# Patient Record
Sex: Male | Born: 1960 | Race: White | Hispanic: No | Marital: Single | Smoking: Current every day smoker
Health system: Southern US, Community
[De-identification: ages and names within clinical notes are randomized; demographics above are authoritative.]

## PROBLEM LIST (undated history)

## (undated) DIAGNOSIS — B009 Herpesviral infection, unspecified: Secondary | ICD-10-CM

## (undated) DIAGNOSIS — K219 Gastro-esophageal reflux disease without esophagitis: Secondary | ICD-10-CM

## (undated) DIAGNOSIS — F419 Anxiety disorder, unspecified: Secondary | ICD-10-CM

## (undated) DIAGNOSIS — E7800 Pure hypercholesterolemia, unspecified: Secondary | ICD-10-CM

## (undated) HISTORY — DX: Herpesviral infection, unspecified: B00.9

## (undated) HISTORY — DX: Gastro-esophageal reflux disease without esophagitis: K21.9

## (undated) HISTORY — DX: Anxiety disorder, unspecified: F41.9

---

## 2000-08-09 ENCOUNTER — Encounter: Payer: Self-pay | Admitting: Family Medicine

## 2000-08-09 ENCOUNTER — Encounter: Admission: RE | Admit: 2000-08-09 | Discharge: 2000-08-09 | Payer: Self-pay | Admitting: Family Medicine

## 2002-02-02 ENCOUNTER — Encounter: Payer: Self-pay | Admitting: Family Medicine

## 2002-02-02 ENCOUNTER — Encounter: Admission: RE | Admit: 2002-02-02 | Discharge: 2002-02-02 | Payer: Self-pay | Admitting: Family Medicine

## 2004-04-20 ENCOUNTER — Ambulatory Visit (HOSPITAL_COMMUNITY): Admission: RE | Admit: 2004-04-20 | Discharge: 2004-04-20 | Payer: Self-pay | Admitting: Urology

## 2004-04-20 ENCOUNTER — Encounter (INDEPENDENT_AMBULATORY_CARE_PROVIDER_SITE_OTHER): Payer: Self-pay | Admitting: Specialist

## 2004-04-20 ENCOUNTER — Ambulatory Visit (HOSPITAL_BASED_OUTPATIENT_CLINIC_OR_DEPARTMENT_OTHER): Admission: RE | Admit: 2004-04-20 | Discharge: 2004-04-20 | Payer: Self-pay | Admitting: Urology

## 2005-01-31 ENCOUNTER — Ambulatory Visit (HOSPITAL_COMMUNITY): Admission: RE | Admit: 2005-01-31 | Discharge: 2005-01-31 | Payer: Self-pay | Admitting: Gastroenterology

## 2009-03-04 ENCOUNTER — Ambulatory Visit (HOSPITAL_BASED_OUTPATIENT_CLINIC_OR_DEPARTMENT_OTHER): Admission: RE | Admit: 2009-03-04 | Discharge: 2009-03-04 | Payer: Self-pay | Admitting: Urology

## 2010-01-31 ENCOUNTER — Encounter
Admission: RE | Admit: 2010-01-31 | Discharge: 2010-01-31 | Payer: Self-pay | Source: Home / Self Care | Attending: Gastroenterology | Admitting: Gastroenterology

## 2010-05-07 LAB — POCT HEMOGLOBIN-HEMACUE: Hemoglobin: 15.8 g/dL (ref 13.0–17.0)

## 2010-07-07 NOTE — Op Note (Signed)
NAME:  Robert Santana, Robert Santana                 ACCOUNT NO.:  [id number]   MEDICAL RECORD NO.:  [id number]          PATIENT TYPE:  AMB   LOCATION:  [facility]                         FACILITY:  [facility]   PHYSICIAN:  Sigmund I. Patsi Sears, M.D.DATE OF BIRTH:  08-22-1960   DATE OF PROCEDURE:  04/20/2004  DATE OF DISCHARGE:                                 OPERATIVE REPORT   PREOPERATIVE DIAGNOSIS:  Right scrotal mass and elective sterilization.   POSTOPERATIVE DIAGNOSIS:  Right scrotal mass and elective sterilization.   OPERATION/PROCEDURE:  1.  Right scrotal expiration with right partial epididymectomy and excision      of large right scrotal cystic mass.  2.  Vasectomy.   SURGEON:  Sigmund I. Patsi Sears, M.D.   ANESTHESIA:  General, LMA.   DESCRIPTION OF PROCEDURE:  After appropriate preanesthesia, the patient was  brought to the operating room, placed on the operating table in dorsal  supine position where general LMA anesthesia was introduced.  He remained in  this position and the pubis was prepped with Betadine solution and soap in  the scrotum which was prepped with Betadine solution and draped in the usual  fashion.   A 4 cm right hemiscrotal incision was made, the testicle delivered in the  wound.  Enlarged 3 cm to 5 cm right epididymal cyst was identified,  necessitating partial epididymectomy.  This was accomplished by dissecting  the epididymis free from the testicle and doubly clamping and incising the  epididymis.  The tail was ligated with 2-0 Vicryl suture.   The remaining portion of the epididymis was dissected.  It consisted of a  epididymal nodule, measuring approximately 5 mm to 6 mm, and a 3 cm  epididymal cyst, which was drained.  The epididymal cyst was removed with  the distal portion ligated with 2-0 Vicryl suture.  Xylocaine 1% plain was  injected into the cord, into the wound edges.  The testicle was delivered  into the wound and the wound closed in two layers with 3-0  Vicryl suture,  running and interrupted suture.  Collodium was placed on the skin.   The vas was identified in the left hemiscrotum, and a 1.5 cm incision was  made over the vas.  Subcutaneous tissue was dissected and the vas delivered  in the wound.  Xylocaine was again injected proximally and distally and into  the  wound.  The vas was doubly clamped.  A portion removed.  Each end was then  cauterized, and ligated with 3-0 Vicryl suture.  The wound then closed in  two layers with 3-0 Vicryl suture.  The patient tolerated the procedure  well.  He was given IV Toradol, awakened and taken to the recovery room in  good condition.      SIT/MEDQ  D:  04/20/2004  T:  04/20/2004  Job:  [id number]

## 2011-10-31 ENCOUNTER — Other Ambulatory Visit: Payer: Self-pay | Admitting: Family Medicine

## 2011-10-31 ENCOUNTER — Other Ambulatory Visit (HOSPITAL_COMMUNITY): Payer: Self-pay | Admitting: Family Medicine

## 2011-10-31 DIAGNOSIS — R0989 Other specified symptoms and signs involving the circulatory and respiratory systems: Secondary | ICD-10-CM

## 2011-10-31 DIAGNOSIS — F17200 Nicotine dependence, unspecified, uncomplicated: Secondary | ICD-10-CM

## 2011-11-05 ENCOUNTER — Ambulatory Visit (HOSPITAL_COMMUNITY)
Admission: RE | Admit: 2011-11-05 | Discharge: 2011-11-05 | Disposition: A | Discharge status: Home / Self Care | Payer: BC Managed Care – PPO | Source: Ambulatory Visit | Attending: Family Medicine | Admitting: Family Medicine

## 2011-11-05 DIAGNOSIS — R0602 Shortness of breath: Secondary | ICD-10-CM | POA: Insufficient documentation

## 2011-11-05 MED ORDER — ALBUTEROL SULFATE (5 MG/ML) 0.5% IN NEBU
2.5000 mg | INHALATION_SOLUTION | Freq: Once | RESPIRATORY_TRACT | Status: AC
  Administered 2011-11-05: 2.5 mg via RESPIRATORY_TRACT

## 2011-11-06 ENCOUNTER — Ambulatory Visit
Admission: RE | Admit: 2011-11-06 | Discharge: 2011-11-06 | Disposition: A | Discharge status: Home / Self Care | Payer: No Typology Code available for payment source | Source: Ambulatory Visit | Attending: Family Medicine | Admitting: Family Medicine

## 2011-11-06 DIAGNOSIS — F17200 Nicotine dependence, unspecified, uncomplicated: Secondary | ICD-10-CM

## 2011-12-31 ENCOUNTER — Other Ambulatory Visit: Payer: Self-pay | Admitting: Dermatology

## 2013-08-19 IMAGING — CT CT CHEST NODULE FOLLOW UP LOW DOSE W/O CM
2 of 4 series · 15 of 36 positions shown, 18 images · non-contrast
Comparison: No priors.

CLINICAL DATA: History of smoking.  Lung cancer screening.

CT CHEST SCREENING WITHOUT CONTRAST
TECHNIQUE: Multidetector CT imaging of the chest was performed
following the standard low-dose protocol without IV contrast.

[Series 3: lung windows · axial · 0.70mm/px · z∈[-278,+21]mm · 12 of 265 slices shown, 15 images]
[im 13/265  mediastinal]
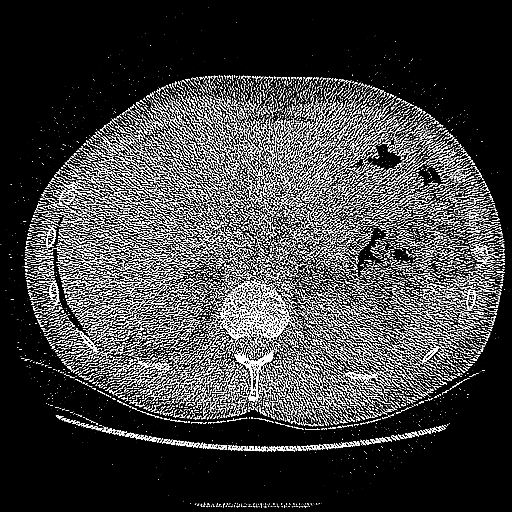
[im 13/265  lung]
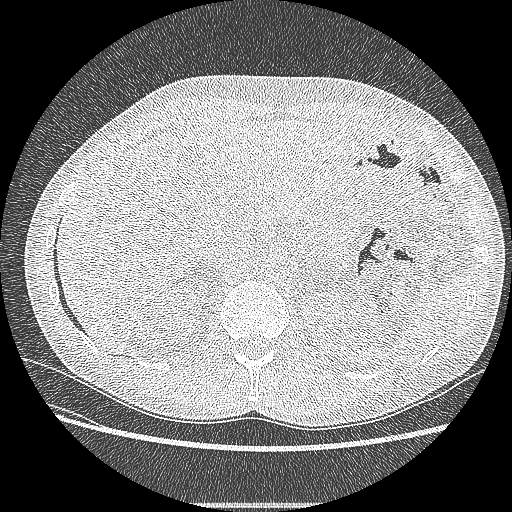
[im 38/265  lung]
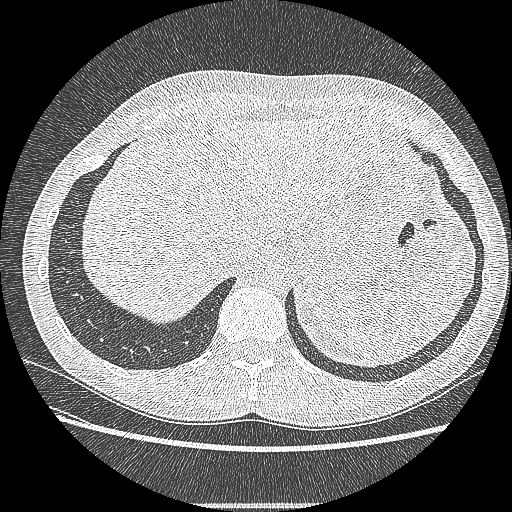
[im 63/265  lung]
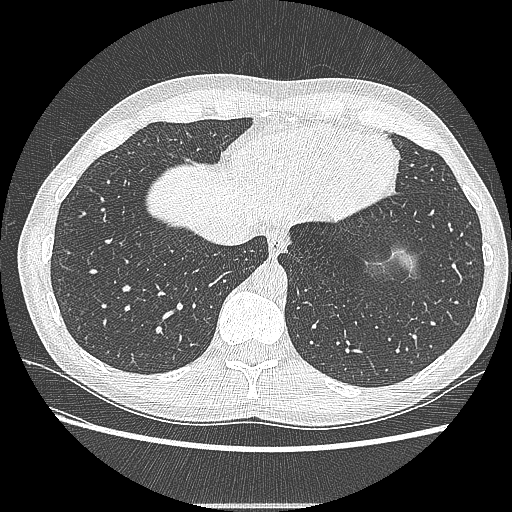
[im 76/265  lung]
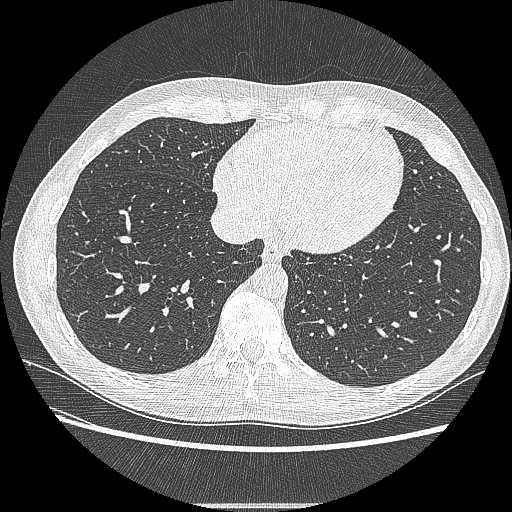
[im 101/265  mediastinal]
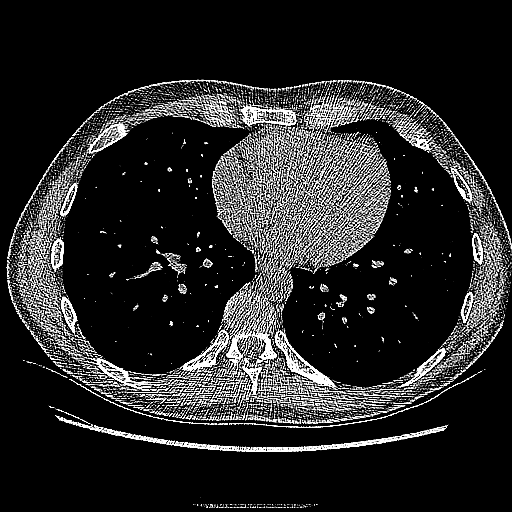
[im 101/265  lung]
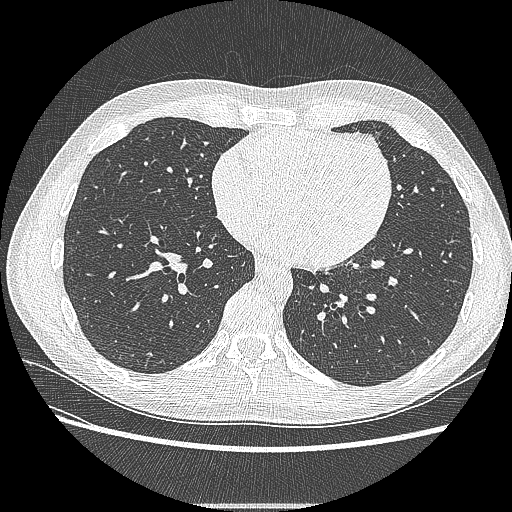
[im 126/265  lung]
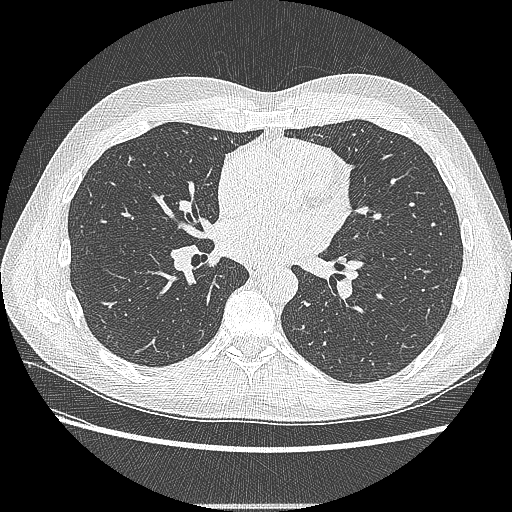
[im 139/265  lung]
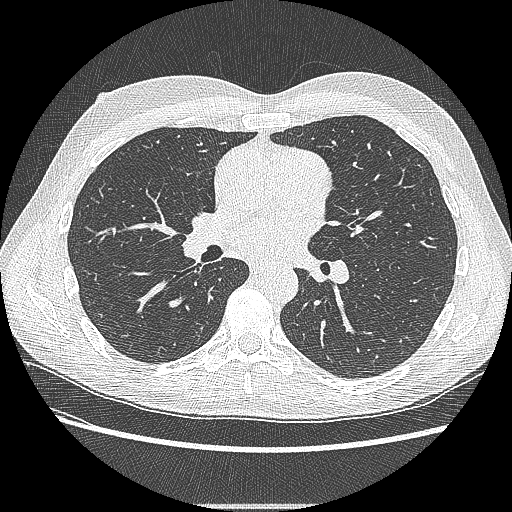
[im 164/265  lung]
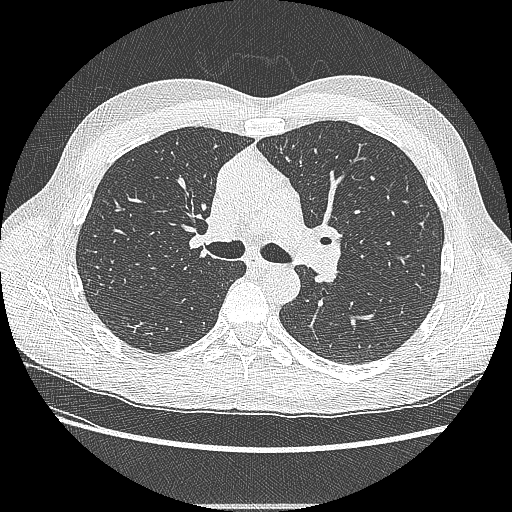
[im 189/265  mediastinal]
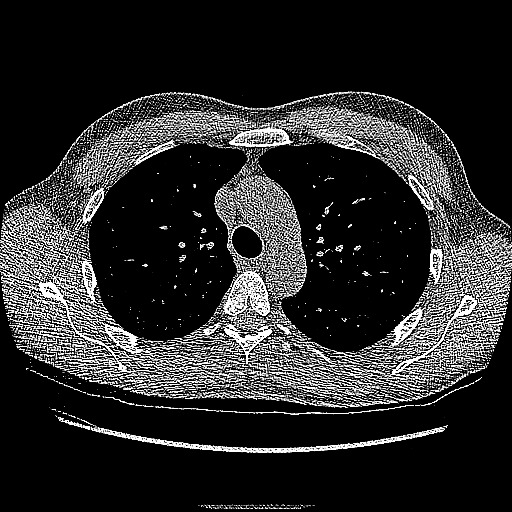
[im 189/265  lung]
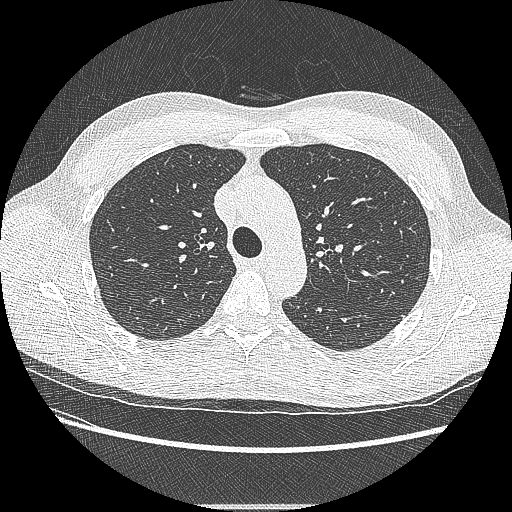
[im 202/265  lung]
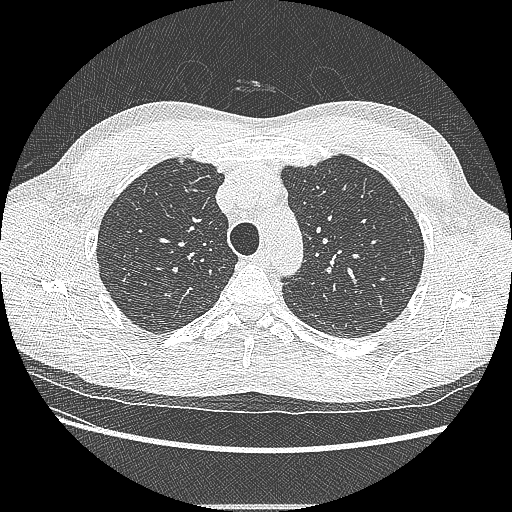
[im 227/265  lung]
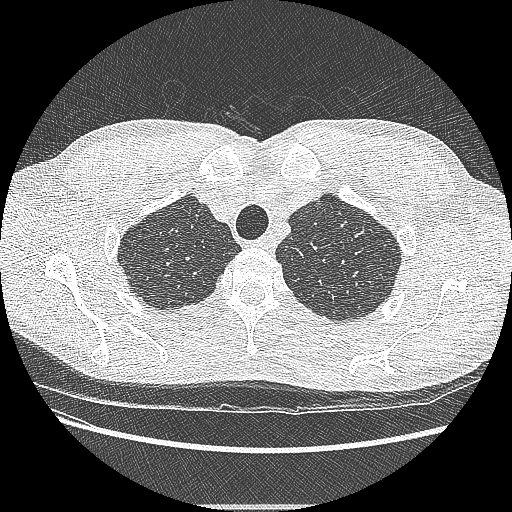
[im 252/265  lung]
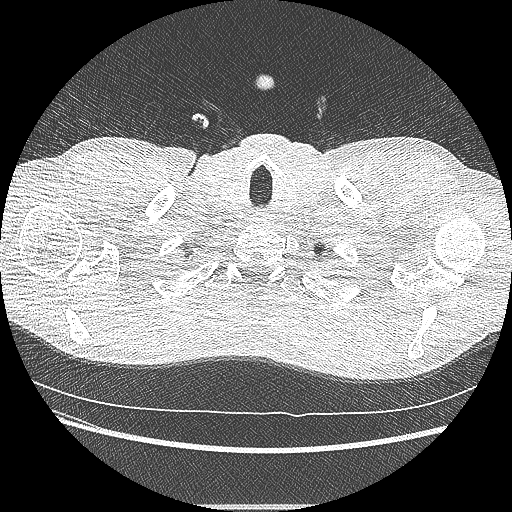

[Series 400: coronal · coronal · 0.70mm/px · 3 of 106 slices shown]
[im 22/106  lung]
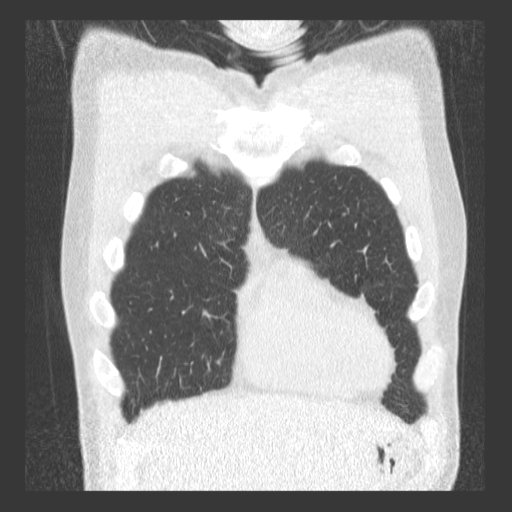
[im 43/106  lung]
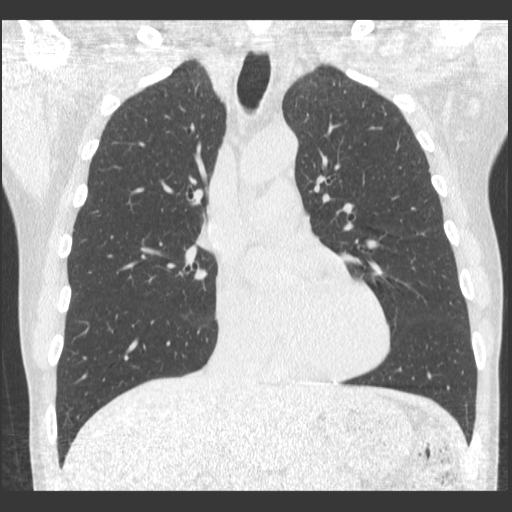
[im 64/106  lung]
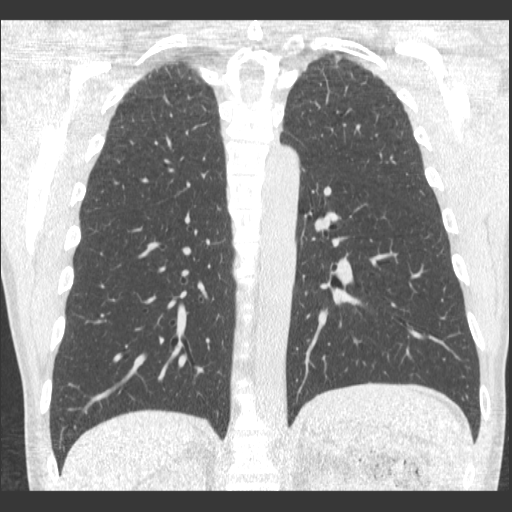

[15 of 36 positions shown; findings below may reference images not displayed]

FINDINGS: Mediastinum: Heart size is normal. There is no significant
pericardial fluid, thickening or pericardial calcification.  Mild
ectasia of the ascending thoracic aorta (4.2 cm in diameter). No
pathologically enlarged mediastinal or hilar lymph nodes. Please
note that accurate exclusion of hilar adenopathy is limited on
noncontrast CT scans.  Esophagus is unremarkable in appearance.

Lungs/Pleura: Mild bilateral apical pleuroparenchymal scarring,
likely secondary to prior infection.  No definite suspicious
appearing pulmonary nodules or masses are identified.  No
consolidative airspace disease.  No pleural effusions.

Upper Abdomen: Unremarkable.

Musculoskeletal: There are no aggressive appearing lytic or blastic
lesions noted in the visualized portions of the skeleton.
IMPRESSION: 1.  No suspicious abnormalities noted in the lungs on today's
examination to suggest lung cancer.
2.  Mild ectasia of the ascending thoracic aorta (4.2 cm in
diameter).

## 2017-03-01 ENCOUNTER — Ambulatory Visit
Admission: RE | Admit: 2017-03-01 | Discharge: 2017-03-01 | Disposition: A | Discharge status: Home / Self Care | Payer: 59 | Source: Ambulatory Visit | Attending: Family Medicine | Admitting: Family Medicine

## 2017-03-01 ENCOUNTER — Other Ambulatory Visit: Payer: Self-pay | Admitting: Family Medicine

## 2017-03-01 DIAGNOSIS — R05 Cough: Secondary | ICD-10-CM

## 2017-03-01 DIAGNOSIS — R059 Cough, unspecified: Secondary | ICD-10-CM

## 2018-12-13 IMAGING — CR DG CHEST 2V
2 series · 2 of 2 positions shown · non-contrast
Comparison: CT 11/06/2011.

CLINICAL DATA: Dry cough.  Chest congestion.

EXAM:
CHEST  2 VIEW

[w chest pa]
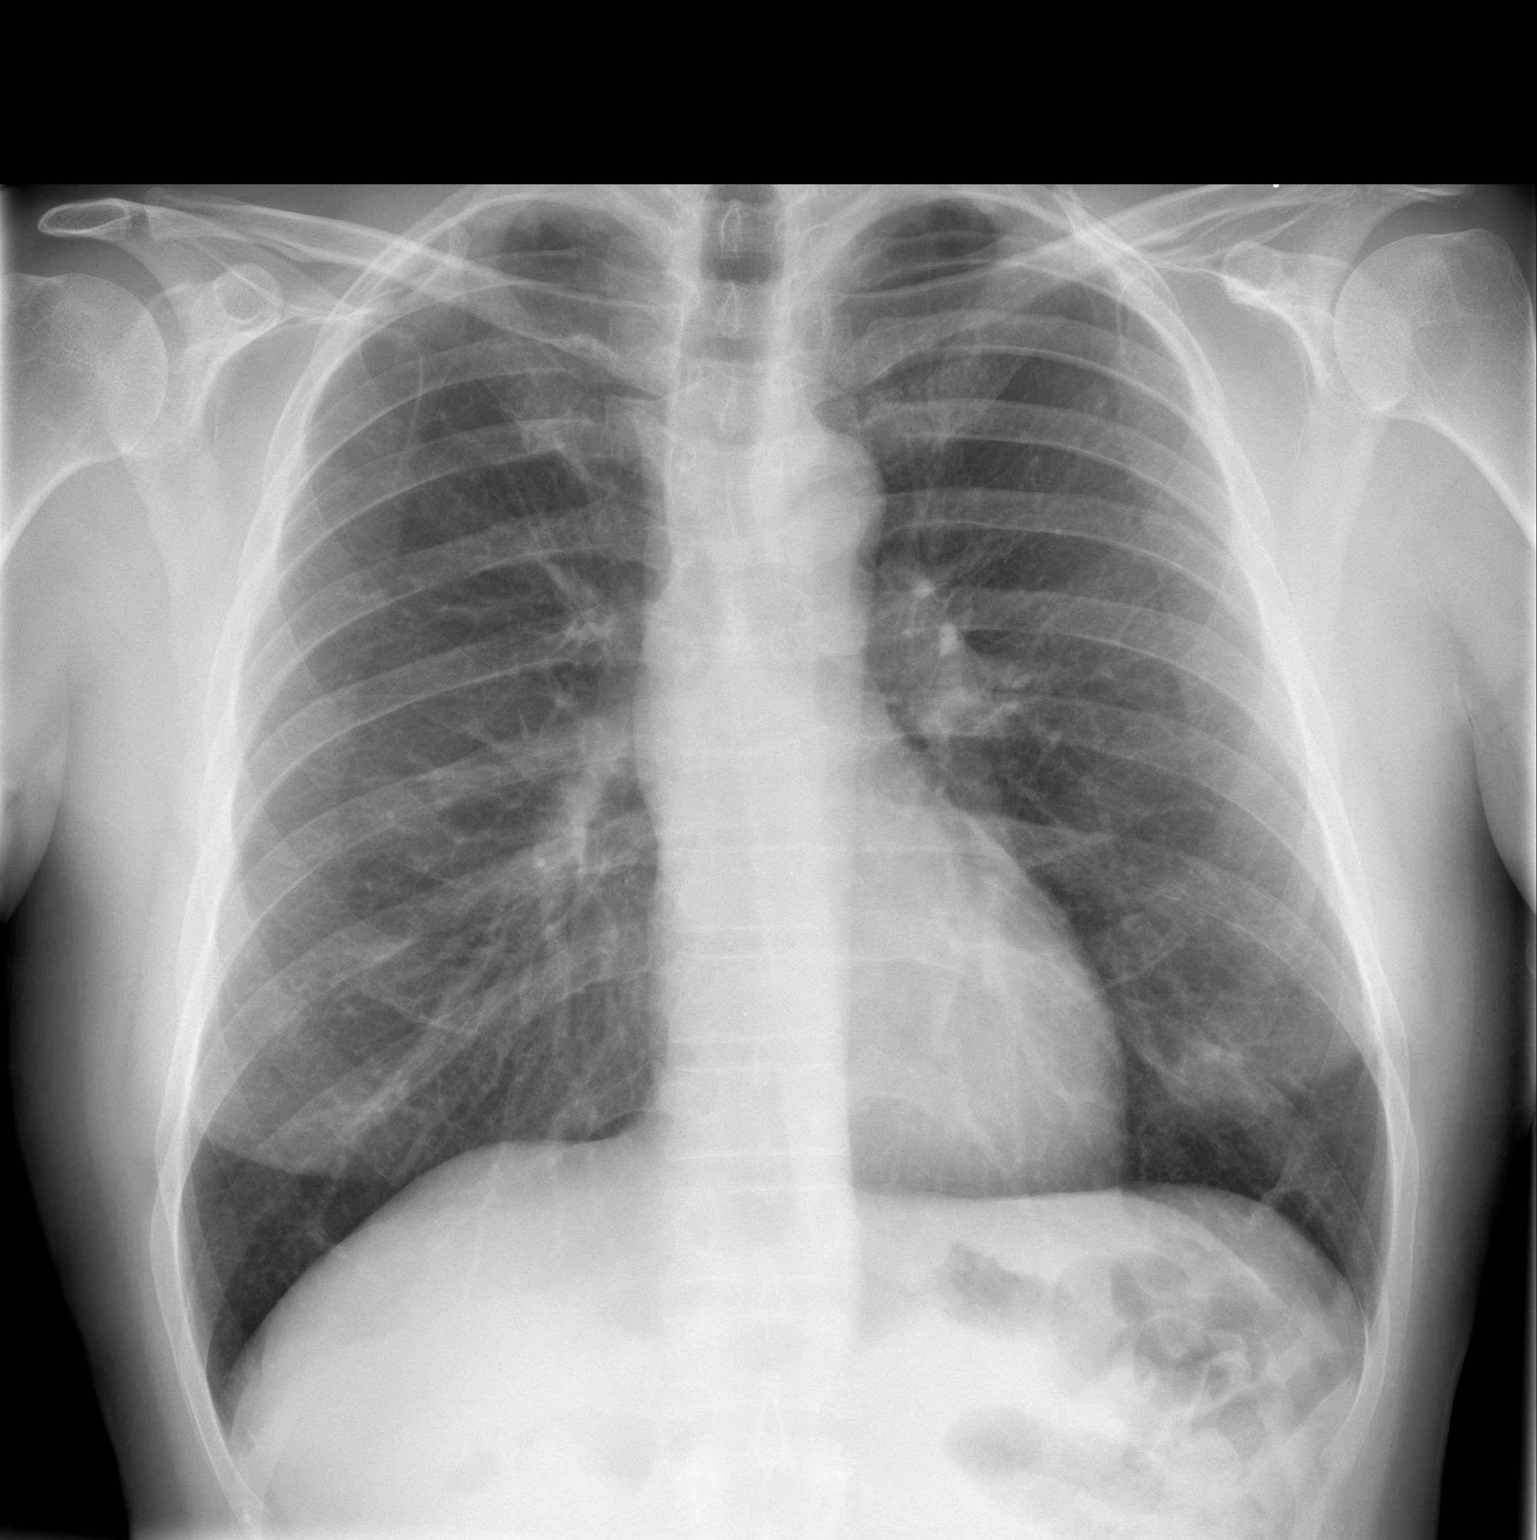

[w chest lat]
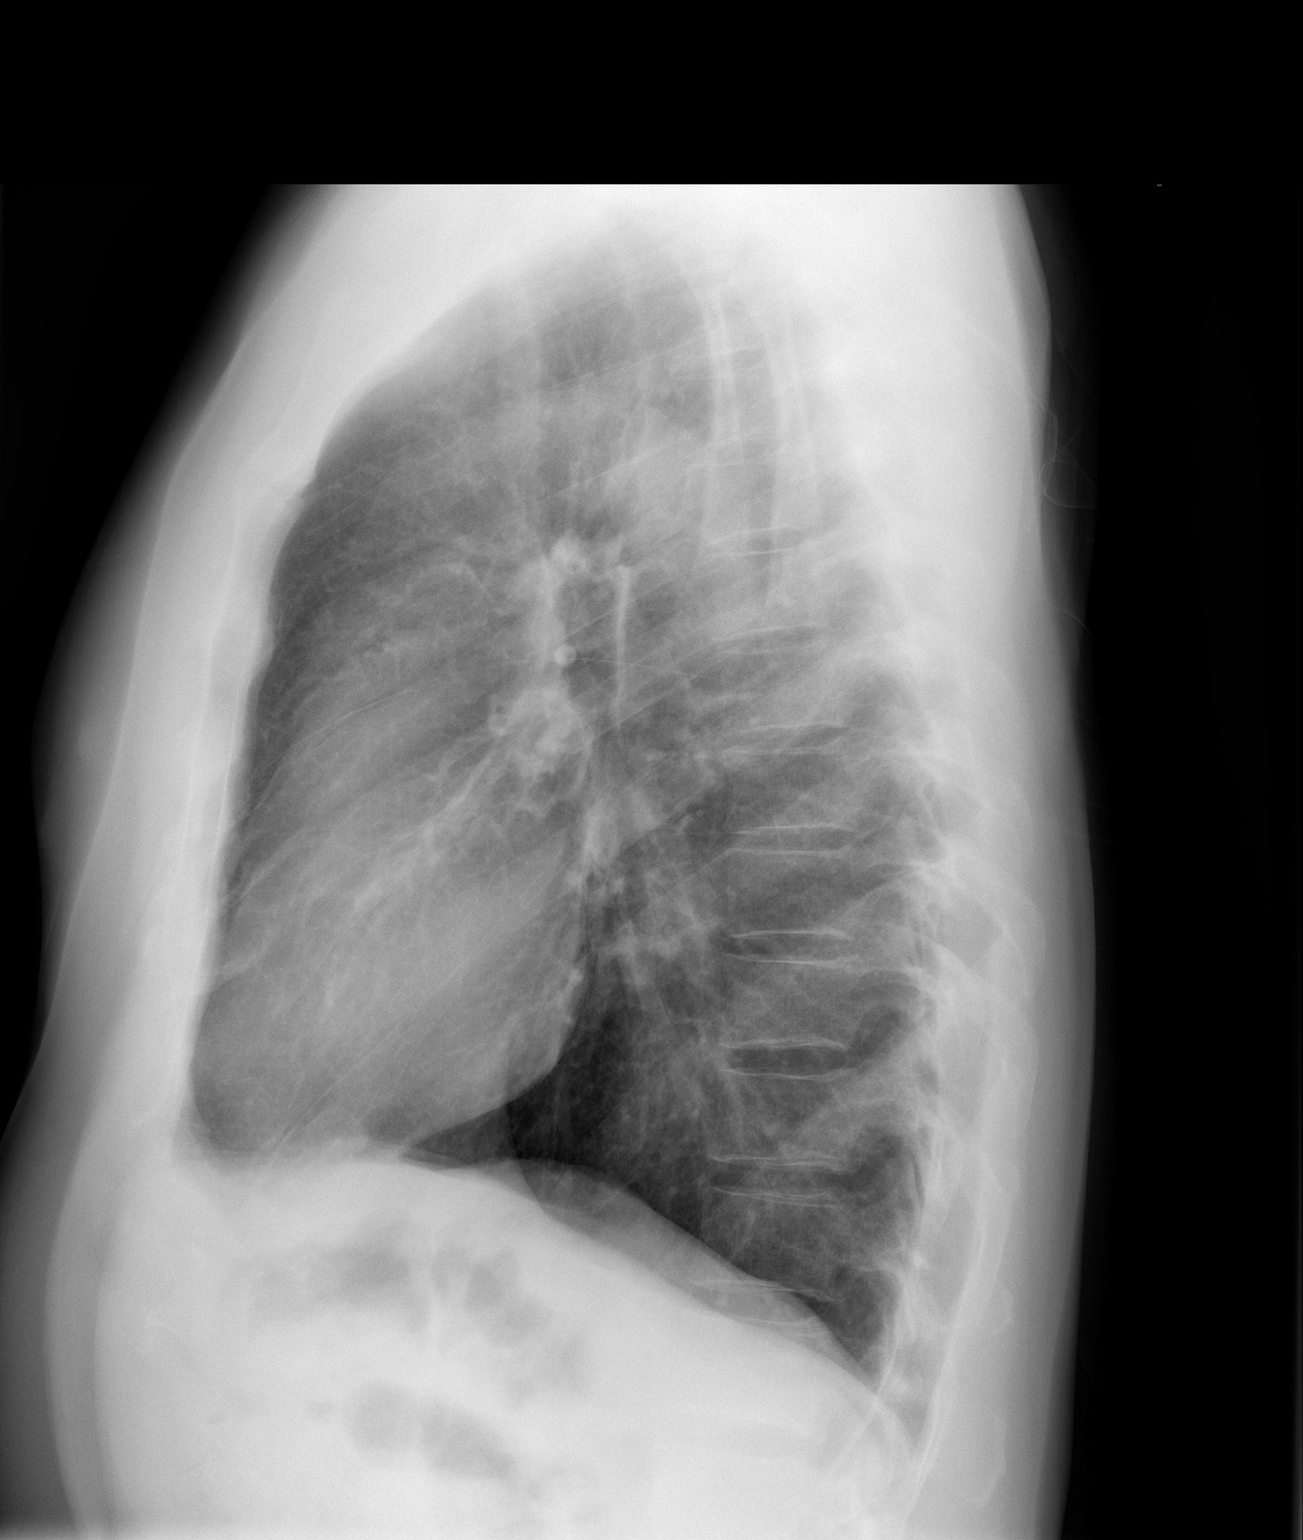

[2 of 2 positions shown; findings below may reference images not displayed]

FINDINGS: Mediastinum and hilar structures are normal. Lungs are clear.
Biapical pleural-parenchymal thickening consistent with scarring. No
pleural effusion or pneumothorax. Heart size normal. Mild scoliosis
thoracic spine. No acute bony abnormality.
IMPRESSION: 1. Biapical pleural-parenchymal thickening consistent with scarring.

2.  No acute cardiopulmonary disease.

## 2019-05-08 ENCOUNTER — Ambulatory Visit: Payer: 59 | Attending: Internal Medicine

## 2019-05-08 DIAGNOSIS — Z23 Encounter for immunization: Secondary | ICD-10-CM

## 2019-05-08 NOTE — Progress Notes (Signed)
   Covid-19 Vaccination Clinic  Name:  FIRAS GUARDADO    MRN: [id number] DOB: [birth year]  05/08/2019  Mr. Vogan was observed post Covid-19 immunization for 30 minutes based on pre-vaccination screening without incident. He was provided with Vaccine Information Sheet and instruction to access the V-Safe system.   Mr. Winkowski was instructed to call 911 with any severe reactions post vaccine: Marland Kitchen Difficulty breathing  . Swelling of face and throat  . A fast heartbeat  . A bad rash all over body  . Dizziness and weakness   Immunizations Administered    Name Date Dose VIS Date Route   Pfizer COVID-19 Vaccine 05/08/2019  9:01 AM 0.3 mL 01/30/2019 Intramuscular   Manufacturer: [employer], [employer]   Lot: [id number]   NDC: [address]-0

## 2019-06-02 ENCOUNTER — Ambulatory Visit: Payer: 59 | Attending: Internal Medicine

## 2019-06-02 DIAGNOSIS — Z23 Encounter for immunization: Secondary | ICD-10-CM

## 2019-06-02 NOTE — Progress Notes (Signed)
   Covid-19 Vaccination Clinic  Name:  HULBERT BRANSCOME    MRN: [id number] DOB: April 18, 1960  06/02/2019  Mr. Plaskett was observed post Covid-19 immunization for 15 minutes without incident. He was provided with Vaccine Information Sheet and instruction to access the V-Safe system.   Mr. Deboy was instructed to call 911 with any severe reactions post vaccine: Marland Kitchen Difficulty breathing  . Swelling of face and throat  . A fast heartbeat  . A bad rash all over body  . Dizziness and weakness   Immunizations Administered    Name Date Dose VIS Date Route   Pfizer COVID-19 Vaccine 06/02/2019  9:42 AM 0.3 mL 01/30/2019 Intramuscular   Manufacturer: [employer], [employer]   Lot: [id number]   NDC: [address]-8

## 2020-08-14 ENCOUNTER — Emergency Department (HOSPITAL_COMMUNITY)
Admission: EM | Admit: 2020-08-14 | Discharge: 2020-08-14 | Disposition: A | Discharge status: Home / Self Care | Payer: BLUE CROSS/BLUE SHIELD | Attending: Emergency Medicine | Admitting: Emergency Medicine

## 2020-08-14 ENCOUNTER — Other Ambulatory Visit: Payer: Self-pay

## 2020-08-14 ENCOUNTER — Encounter (HOSPITAL_COMMUNITY): Payer: Self-pay | Admitting: Emergency Medicine

## 2020-08-14 ENCOUNTER — Emergency Department (HOSPITAL_COMMUNITY): Payer: BLUE CROSS/BLUE SHIELD

## 2020-08-14 DIAGNOSIS — I491 Atrial premature depolarization: Secondary | ICD-10-CM | POA: Diagnosis not present

## 2020-08-14 DIAGNOSIS — F17210 Nicotine dependence, cigarettes, uncomplicated: Secondary | ICD-10-CM | POA: Diagnosis not present

## 2020-08-14 DIAGNOSIS — R002 Palpitations: Secondary | ICD-10-CM | POA: Diagnosis present

## 2020-08-14 HISTORY — DX: Pure hypercholesterolemia, unspecified: E78.00

## 2020-08-14 LAB — CBC
HCT: 42.2 % (ref 39.0–52.0)
Hemoglobin: 14.3 g/dL (ref 13.0–17.0)
MCH: 32.7 pg (ref 26.0–34.0)
MCHC: 33.9 g/dL (ref 30.0–36.0)
MCV: 96.6 fL (ref 80.0–100.0)
Platelets: 215 10*3/uL (ref 150–400)
RBC: 4.37 MIL/uL (ref 4.22–5.81)
RDW: 12.4 % (ref 11.5–15.5)
WBC: 8 10*3/uL (ref 4.0–10.5)
nRBC: 0 % (ref 0.0–0.2)

## 2020-08-14 LAB — BASIC METABOLIC PANEL
Anion gap: 7 (ref 5–15)
BUN: 16 mg/dL (ref 6–20)
CO2: 24 mmol/L (ref 22–32)
Calcium: 9.2 mg/dL (ref 8.9–10.3)
Chloride: 107 mmol/L (ref 98–111)
Creatinine, Ser: 1.15 mg/dL (ref 0.61–1.24)
GFR, Estimated: >60 mL/min (ref >60)
Glucose, Bld: 98 mg/dL (ref 70–99)
Potassium: 4.2 mmol/L (ref 3.5–5.1)
Sodium: 138 mmol/L (ref 135–145)

## 2020-08-14 LAB — TROPONIN I (HIGH SENSITIVITY): Troponin I (High Sensitivity): 4 ng/L (ref <18)

## 2020-08-14 NOTE — ED Triage Notes (Signed)
Pt reports intermittent palpitations with generalized weakness and dizziness over the past 3 days.  Went to [facility] and sent to ED for abnormal EKG.  Denies chest pain.

## 2020-08-14 NOTE — Discharge Instructions (Addendum)
Please begin taking 81 mg of aspirin daily. Decrease your caffeine intake. Avoid physical exertion until you can follow-up with cardiology and they cleared you to return to activity. You have been provided with referral to cardiology.  They should reach out and contact you in the next couple of days to schedule appointment.  If you have not heard from them in the next few days, you can give them a call to schedule an appointment.  You may need some additional cardiac work-up including stress test. In the meantime follow with your primary care. Return to the emergency department for develop chest pain or worsening symptoms.

## 2020-08-14 NOTE — ED Provider Notes (Signed)
[facility] EMERGENCY DEPARTMENT Provider Note   CSN: [id number] Arrival date & time: 08/14/20  1630     History Chief Complaint  Patient presents with   Palpitations    Robert Santana is a 60 y.o. male with PMHx HLD, presenting from urgent care for evaluation of palpitations that began 3 days ago. Symptoms initially began after a meal, however have been persisting since. He feels like his heart speeds up or skips a beat and sometimes it takes his breath away. He has been feeling somewhat fatigued, unsure if it is related to his increased stress level. 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 No drug use. Drinks about 5 caffeinated beverages daily. No associated chest pain, leg swelling, or diet changes. He states he runs 3 days per week. Does smoke tobacco regularly.   The history is provided by the patient.      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   History reviewed. No pertinent surgical history.     Family History  Problem Relation Age of Onset   Breast cancer Mother     Social History   Tobacco Use   Smoking status: Every Day    Pack years: 0.00    Types: Cigarettes   Smokeless tobacco: Never  Substance Use Topics   Alcohol use: Yes    Alcohol/week: 10.0 standard drinks    Types: 10 drink(s) per week   Drug use: Yes    Home Medications Prior to Admission medications   Medication Sig Start Date End Date Taking? Authorizing Provider  citalopram (CELEXA) 20 MG tablet Take 10 mg by mouth daily. 04/14/20  Yes [provider]  famotidine (PEPCID) 20 MG tablet Take 20 mg by mouth 2 (two) times daily.   Yes [provider]  loratadine (CLARITIN) 10 MG tablet Take 10 mg by mouth daily as needed for allergies.   Yes [provider]  meloxicam (MOBIC) 15 MG tablet Take 15 mg by mouth daily as needed for pain. 04/11/20  Yes [provider]  propranolol (INDERAL) 10 MG tablet Take 10 mg by mouth daily as needed for anxiety. 04/11/20  Yes [provider]  rosuvastatin (CRESTOR) 10 MG tablet Take 10 mg by mouth daily. 04/11/20  Yes [provider]  sildenafil (REVATIO) 20 MG tablet Take 20-100 mg by mouth daily as needed (for ED).   Yes [provider]  traZODone (DESYREL) 50 MG tablet Take 50 mg by mouth at bedtime as needed for sleep. 07/21/20  Yes [provider]    Allergies    Buspar [buspirone], Cymbalta [duloxetine hcl], Lexapro [escitalopram oxalate], Zoloft [sertraline hcl], and Zyban [bupropion]  Review of Systems   Review of Systems  All other systems reviewed and are negative.  Physical Exam Updated Vital Signs BP 123/83   Pulse 70   Temp 97.9 F (36.6 C)   Resp 15   SpO2 99%   Physical Exam Vitals and nursing note reviewed.  Constitutional:      General: He is not in acute distress.    Appearance: He is well-developed. He is not ill-appearing.     Comments: Fit-appearing male  HENT:     Head: Normocephalic and atraumatic.  Eyes:     Conjunctiva/sclera: Conjunctivae normal.  Cardiovascular:     Rate and Rhythm: Normal rate and regular rhythm.     Pulses: Normal pulses.     Heart sounds: Normal heart sounds.  Pulmonary:     Effort: Pulmonary effort is normal. No respiratory distress.     Breath sounds: Normal breath sounds.  Abdominal:     General: Bowel sounds are normal.     Palpations: Abdomen is soft.     Tenderness: There is no abdominal tenderness.  Musculoskeletal:     Right lower leg: No edema.     Left lower leg: No edema.  Skin:    General: Skin is warm.  Neurological:     Mental Status: He is alert.  Psychiatric:        Behavior: Behavior normal.    ED Results / Procedures / Treatments   Labs (all labs ordered are  listed, but only abnormal results are displayed) Labs Reviewed  BASIC METABOLIC PANEL  CBC  TROPONIN I (HIGH SENSITIVITY)    EKG EKG Interpretation  Date/Time:  Sunday August 14 2020 16:39:16 EDT Ventricular Rate:  69 PR Interval:  170 QRS Duration: 96 QT Interval:  374 QTC Calculation: 400 R Axis:   104 Text Interpretation: Sinus rhythm with Premature atrial complexes Biatrial enlargement Rightward axis Abnormal ECG Confirmed by Tilden Fossa [phone]) on 08/14/2020 5:02:17 PM  Radiology DG Chest 2 View  Result Date: 08/14/2020 CLINICAL DATA:  Chest pain EXAM: CHEST - 2 VIEW COMPARISON:  03/01/2017 FINDINGS: The heart size and mediastinal contours are within normal limits. Both lungs are clear. The visualized skeletal structures are unremarkable. IMPRESSION: No active cardiopulmonary disease. Electronically Signed   By: Lauralyn Primes M.D.   On: 08/14/2020 17:09    Procedures Procedures   Medications Ordered in ED Medications - No data to display  ED Course  I have reviewed the triage vital signs and the nursing notes.  Pertinent labs & imaging results that were available during my care of the patient were reviewed by me and considered in my medical decision making (see chart for details).  Clinical Course as of 08/14/20 1851  Sun Aug 14, 2020  9242 Daily asa, no exertion, cards amb referral, dec caffeine [JR]    Clinical Course User Index [JR] Julieanne Hadsall, [place] N, PA-C   MDM Rules/Calculators/A&P                          Patient has 60 year old male presenting for 3 days of intermittent palpitations.  States he is feeling some fatigue though is unsure if this is related to increased stress level at home.  Was seen at urgent care who sent to the ED for abnormal EKG.  EKG shows PACs, otherwise sinus rhythm.  He has the symptom during evaluation, cardiac monitor shows PAC.  This is suspected to be the symptom patient is experiencing.  He does endorse 5 caffeinated beverages  daily, as well as tobacco use.  He does however multiple days per week.  He is not having chest pain, lower extremity edema, or other associated symptoms.  On exam he is well-appearing and in no distress.  Blood work shows troponin within normal limits, metabolic panel and blood counts are also within normal limits.  Chest x-ray is clear.  Patient will be discharged with recommendation to begin daily baby aspirin, avoid physical exertion until he is able to follow up with cardiology outpatient.  Also  recommend decrease caffeine intake.  He is provided with ambulatory referral to cardiology, as well as instruction to follow closely with PCP.  Return if symptoms worsen in any way.  Discussed with attending physician, Dr. Donnald Garre, who guided care plan. Discussed results, findings, treatment and follow up. Patient advised of return precautions. Patient verbalized understanding and agreed with plan.  Final Clinical Impression(s) / ED Diagnoses Final diagnoses:  Premature atrial contractions    Rx / DC Orders ED Discharge Orders          Ordered    Ambulatory referral to Cardiology        08/14/20 1851             Deaira Leckey, [place] N, PA-C 08/14/20 1851    Arby Barrette, MD 08/18/20 [phone]

## 2022-05-28 IMAGING — DX DG CHEST 2V
2 series · 2 of 2 positions shown · non-contrast
Comparison: 03/01/2017

CLINICAL DATA: Chest pain

EXAM:
CHEST - 2 VIEW

[chest pa]
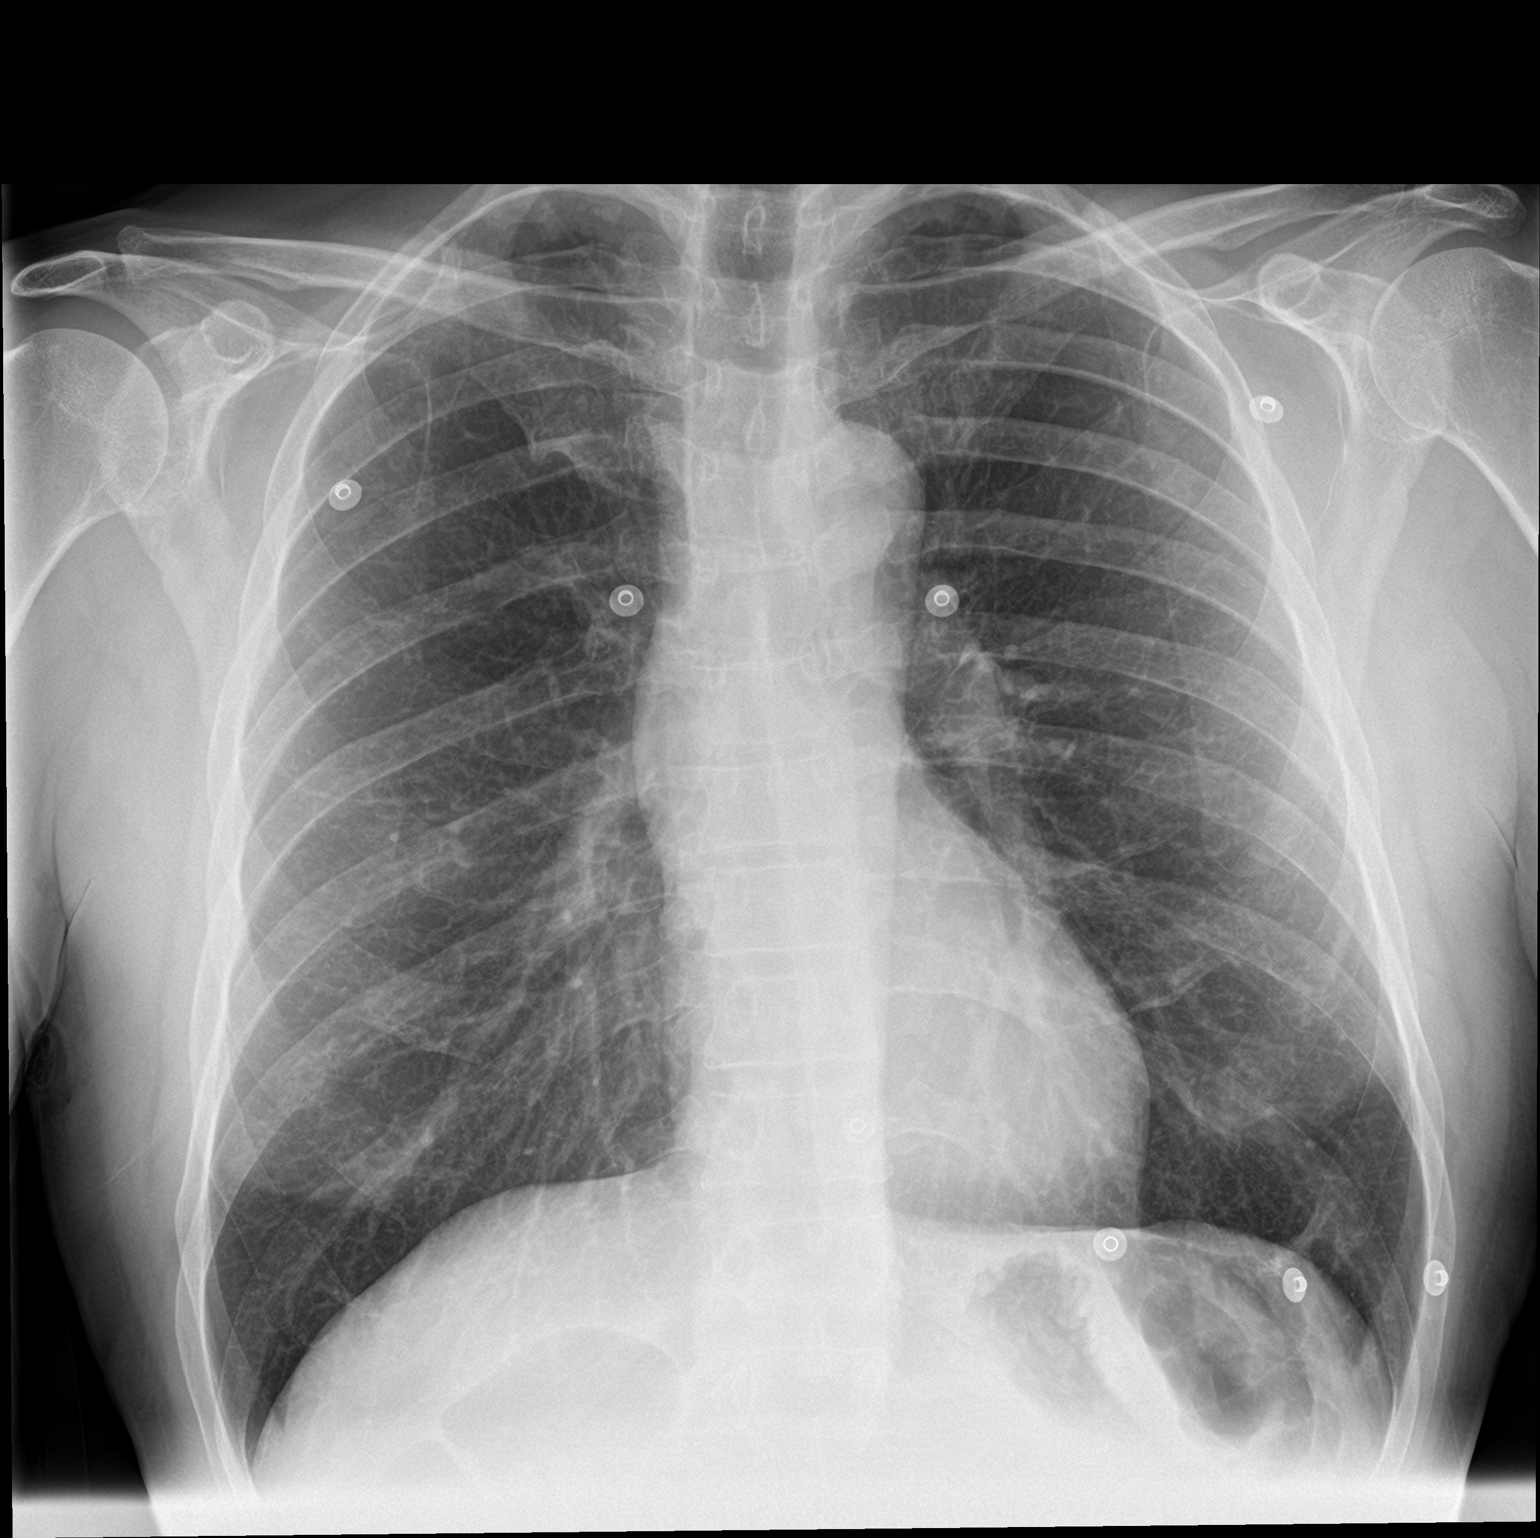

[chest lat]
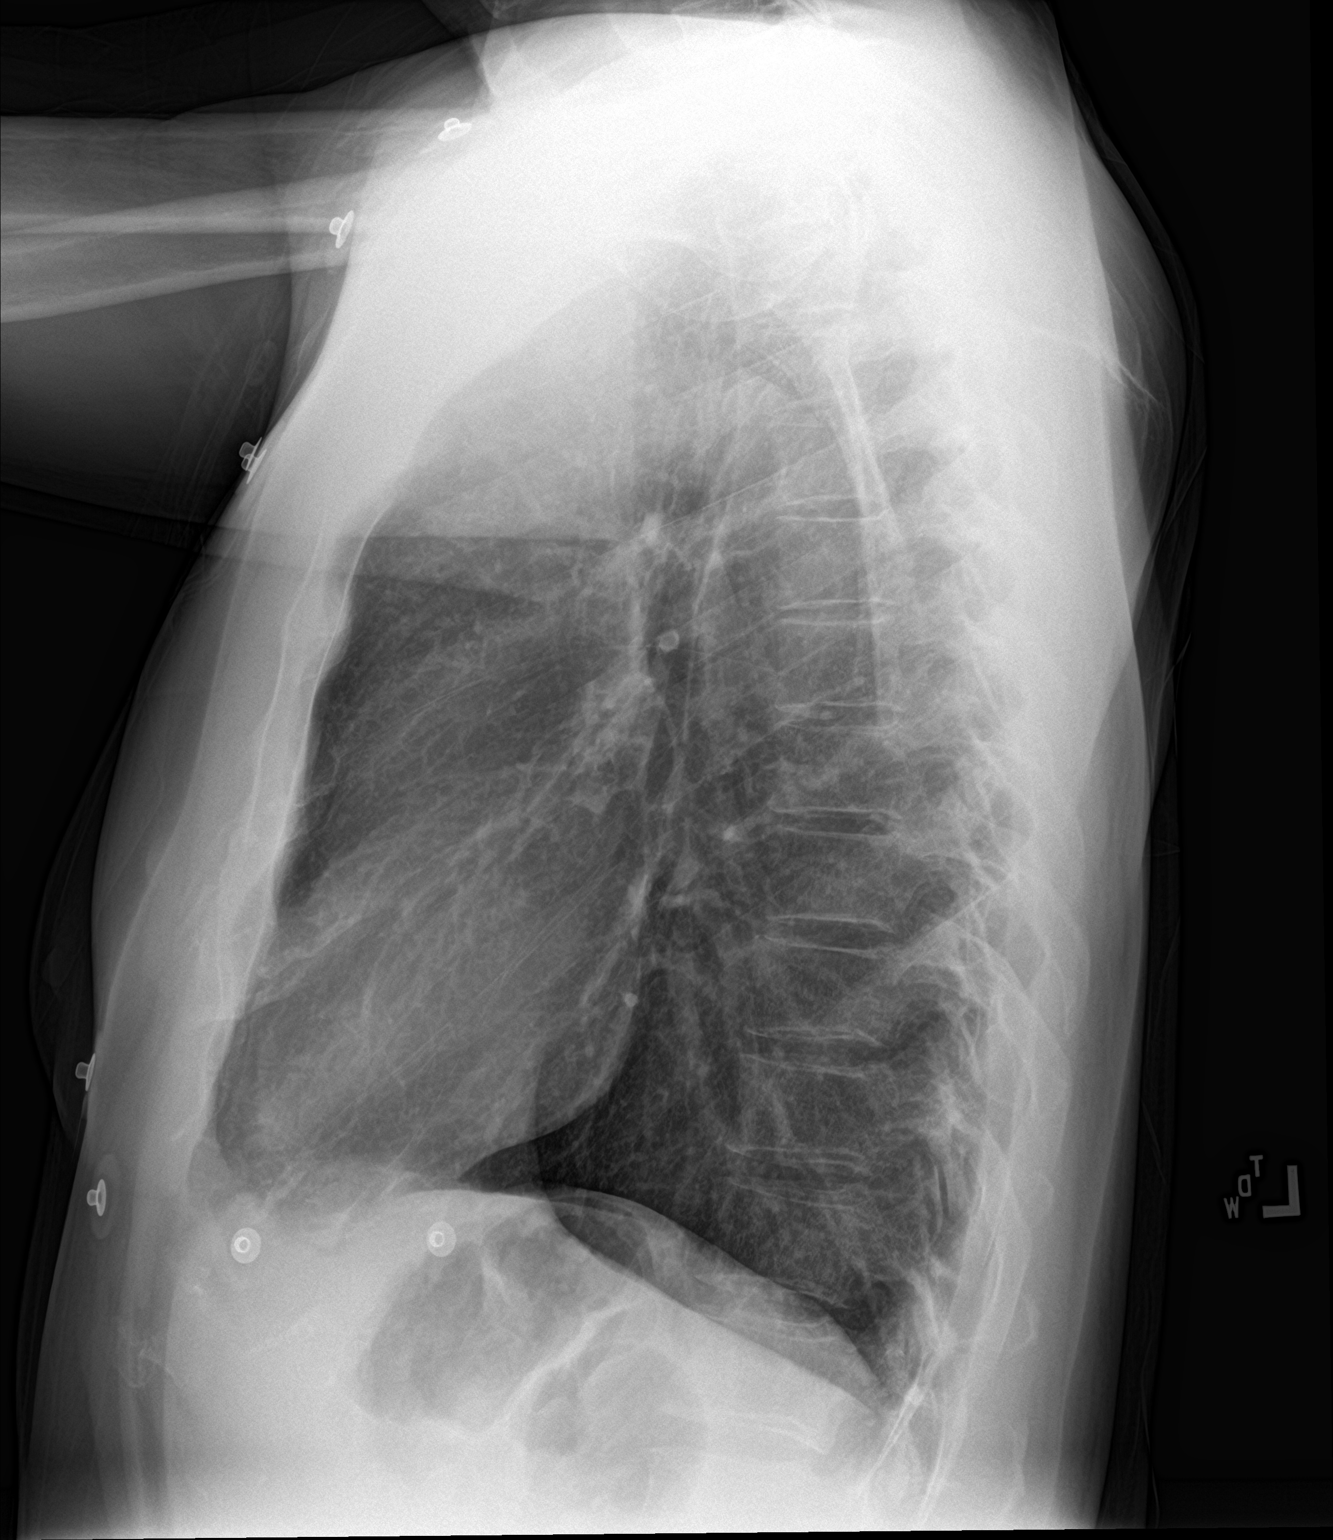

[2 of 2 positions shown; findings below may reference images not displayed]

FINDINGS: The heart size and mediastinal contours are within normal limits.
Both lungs are clear. The visualized skeletal structures are
unremarkable.
IMPRESSION: No active cardiopulmonary disease.
